# Patient Record
Sex: Female | Born: 1965 | Race: White | Hispanic: No | Marital: Married
Health system: Southern US, Community
[De-identification: ages and names within clinical notes are randomized; demographics above are authoritative.]

## PROBLEM LIST (undated history)

## (undated) DIAGNOSIS — I10 Essential (primary) hypertension: Secondary | ICD-10-CM

## (undated) DIAGNOSIS — F32A Depression, unspecified: Secondary | ICD-10-CM

---

## 2020-03-13 ENCOUNTER — Emergency Department (HOSPITAL_COMMUNITY)
Admission: EM | Admit: 2020-03-13 | Discharge: 2020-03-13 | Disposition: A | Discharge status: Home / Self Care | Payer: Self-pay | Attending: Emergency Medicine | Admitting: Emergency Medicine

## 2020-03-13 ENCOUNTER — Emergency Department (HOSPITAL_COMMUNITY): Payer: Self-pay

## 2020-03-13 ENCOUNTER — Other Ambulatory Visit: Payer: Self-pay

## 2020-03-13 ENCOUNTER — Encounter (HOSPITAL_COMMUNITY): Payer: Self-pay

## 2020-03-13 DIAGNOSIS — R0789 Other chest pain: Secondary | ICD-10-CM | POA: Insufficient documentation

## 2020-03-13 DIAGNOSIS — R42 Dizziness and giddiness: Secondary | ICD-10-CM | POA: Insufficient documentation

## 2020-03-13 DIAGNOSIS — Z5321 Procedure and treatment not carried out due to patient leaving prior to being seen by health care provider: Secondary | ICD-10-CM | POA: Insufficient documentation

## 2020-03-13 HISTORY — DX: Essential (primary) hypertension: I10

## 2020-03-13 HISTORY — DX: Depression, unspecified: F32.A

## 2020-03-13 LAB — BASIC METABOLIC PANEL
Anion gap: 12 (ref 5–15)
BUN: 23 mg/dL — ABNORMAL HIGH (ref 6–20)
CO2: 24 mmol/L (ref 22–32)
Calcium: 8.9 mg/dL (ref 8.9–10.3)
Chloride: 105 mmol/L (ref 98–111)
Creatinine, Ser: 0.75 mg/dL (ref 0.44–1.00)
GFR calc Af Amer: >60 mL/min (ref >60)
GFR calc non Af Amer: >60 mL/min (ref >60)
Glucose, Bld: 98 mg/dL (ref 70–99)
Potassium: 3.6 mmol/L (ref 3.5–5.1)
Sodium: 141 mmol/L (ref 135–145)

## 2020-03-13 LAB — CBC
HCT: 42.1 % (ref 36.0–46.0)
Hemoglobin: 13.5 g/dL (ref 12.0–15.0)
MCH: 30.7 pg (ref 26.0–34.0)
MCHC: 32.1 g/dL (ref 30.0–36.0)
MCV: 95.7 fL (ref 80.0–100.0)
Platelets: 286 10*3/uL (ref 150–400)
RBC: 4.4 MIL/uL (ref 3.87–5.11)
RDW: 13.2 % (ref 11.5–15.5)
WBC: 9.1 10*3/uL (ref 4.0–10.5)
nRBC: 0 % (ref 0.0–0.2)

## 2020-03-13 LAB — I-STAT BETA HCG BLOOD, ED (MC, WL, AP ONLY): I-stat hCG, quantitative: 5.3 m[IU]/mL — ABNORMAL HIGH (ref <5)

## 2020-03-13 LAB — TROPONIN I (HIGH SENSITIVITY): Troponin I (High Sensitivity): 5 ng/L (ref <18)

## 2020-03-13 MED ORDER — SODIUM CHLORIDE 0.9% FLUSH: 3.0000 mL | Freq: Once | INTRAVENOUS | Status: DC

## 2020-03-13 NOTE — ED Triage Notes (Signed)
Pt presents to ED with complaints of chest pressure radiating up neck that "feels like something is sitting on her chest". Recent cough. She endorses feeling dizzy. Denies n/v  VSS, 324MG  ASA  20G LAC

## 2020-03-13 NOTE — ED Notes (Signed)
Pt stated they wanted to leave and was encouraged to stay by [facility] staff and the pt decided to leave

## 2021-09-20 IMAGING — DX DG CHEST 2V
2 series · 2 of 2 positions shown · non-contrast
Comparison: None.

CLINICAL DATA: Chest pain and recent cough.  Dizziness.

EXAM:
CHEST - 2 VIEW

[w chest pa]
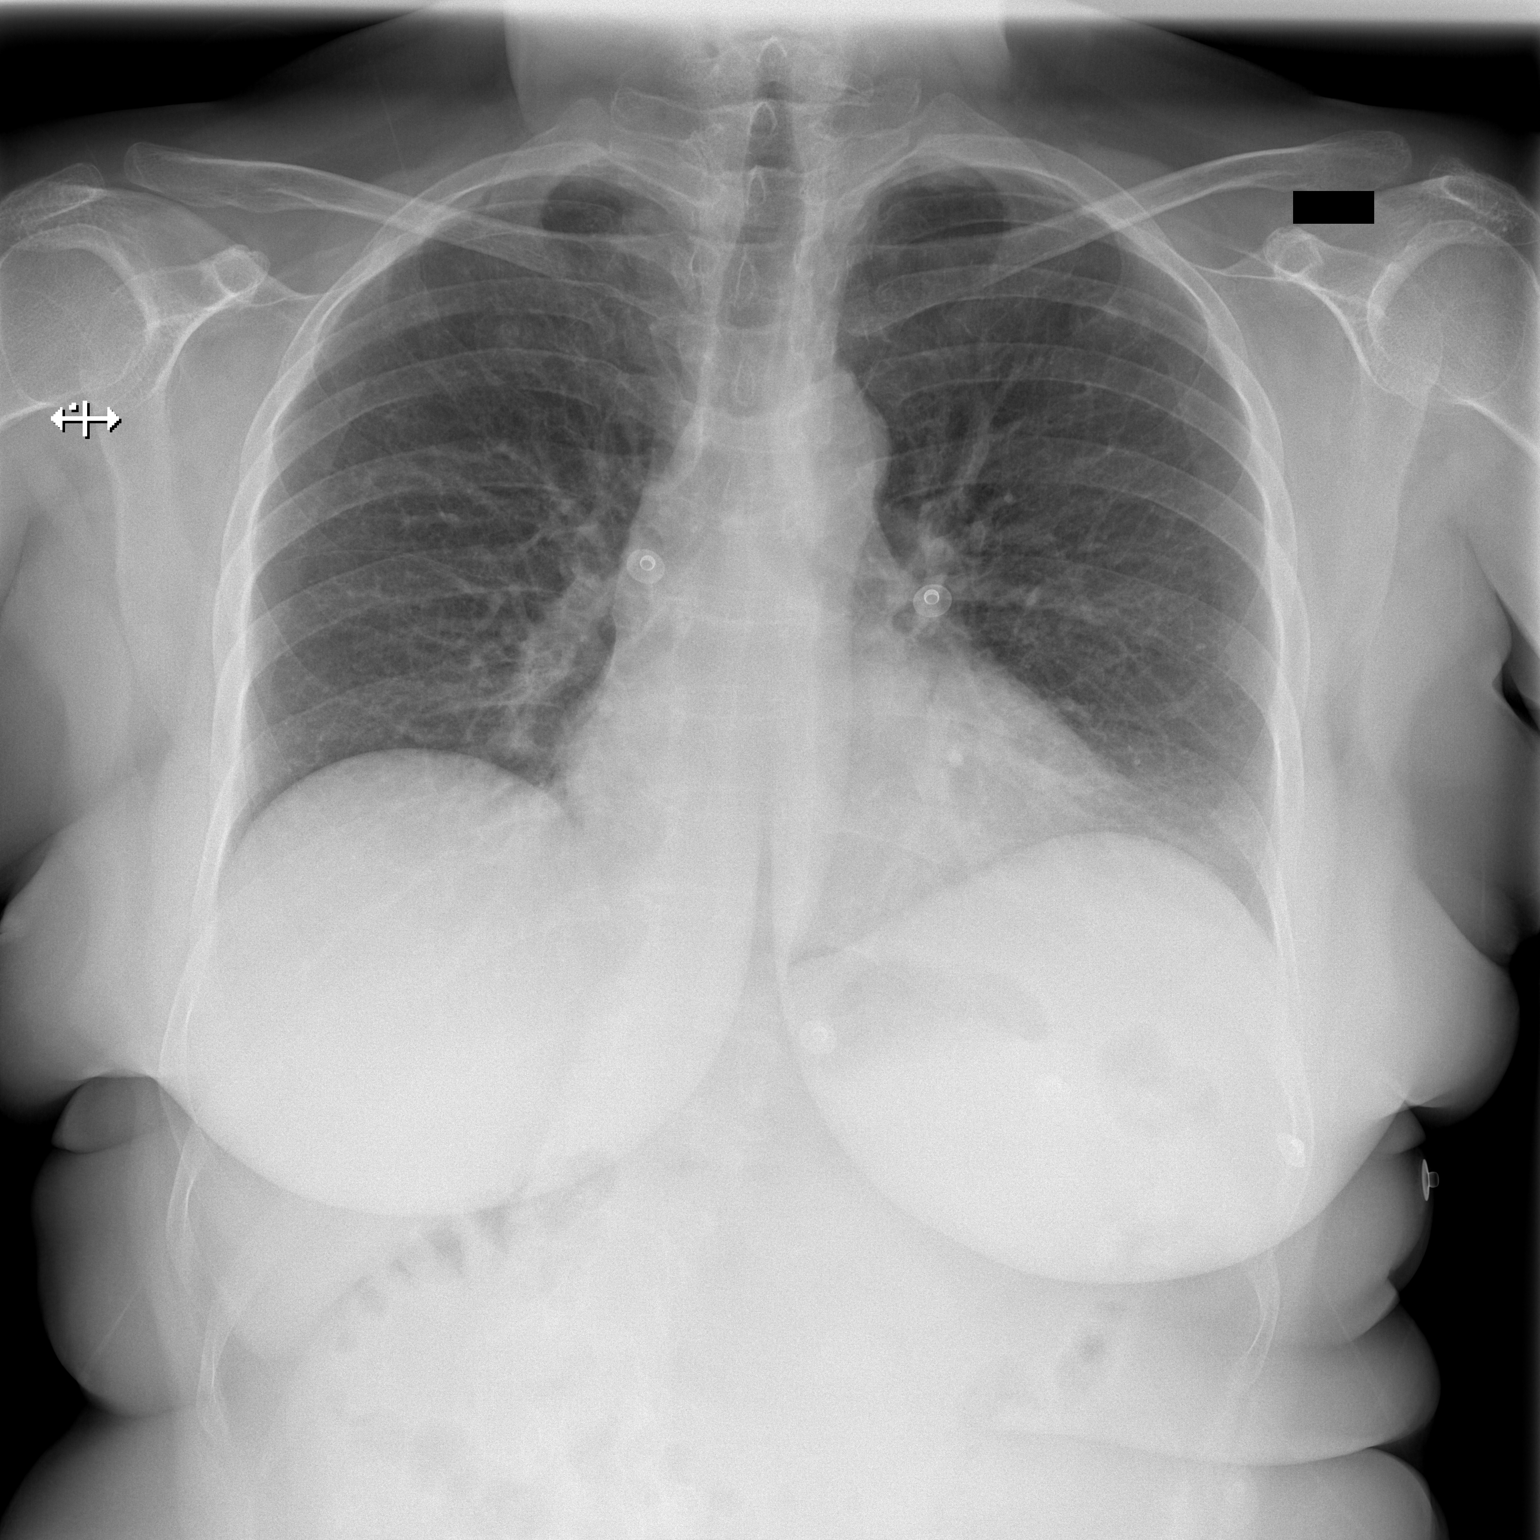

[w chest lat]
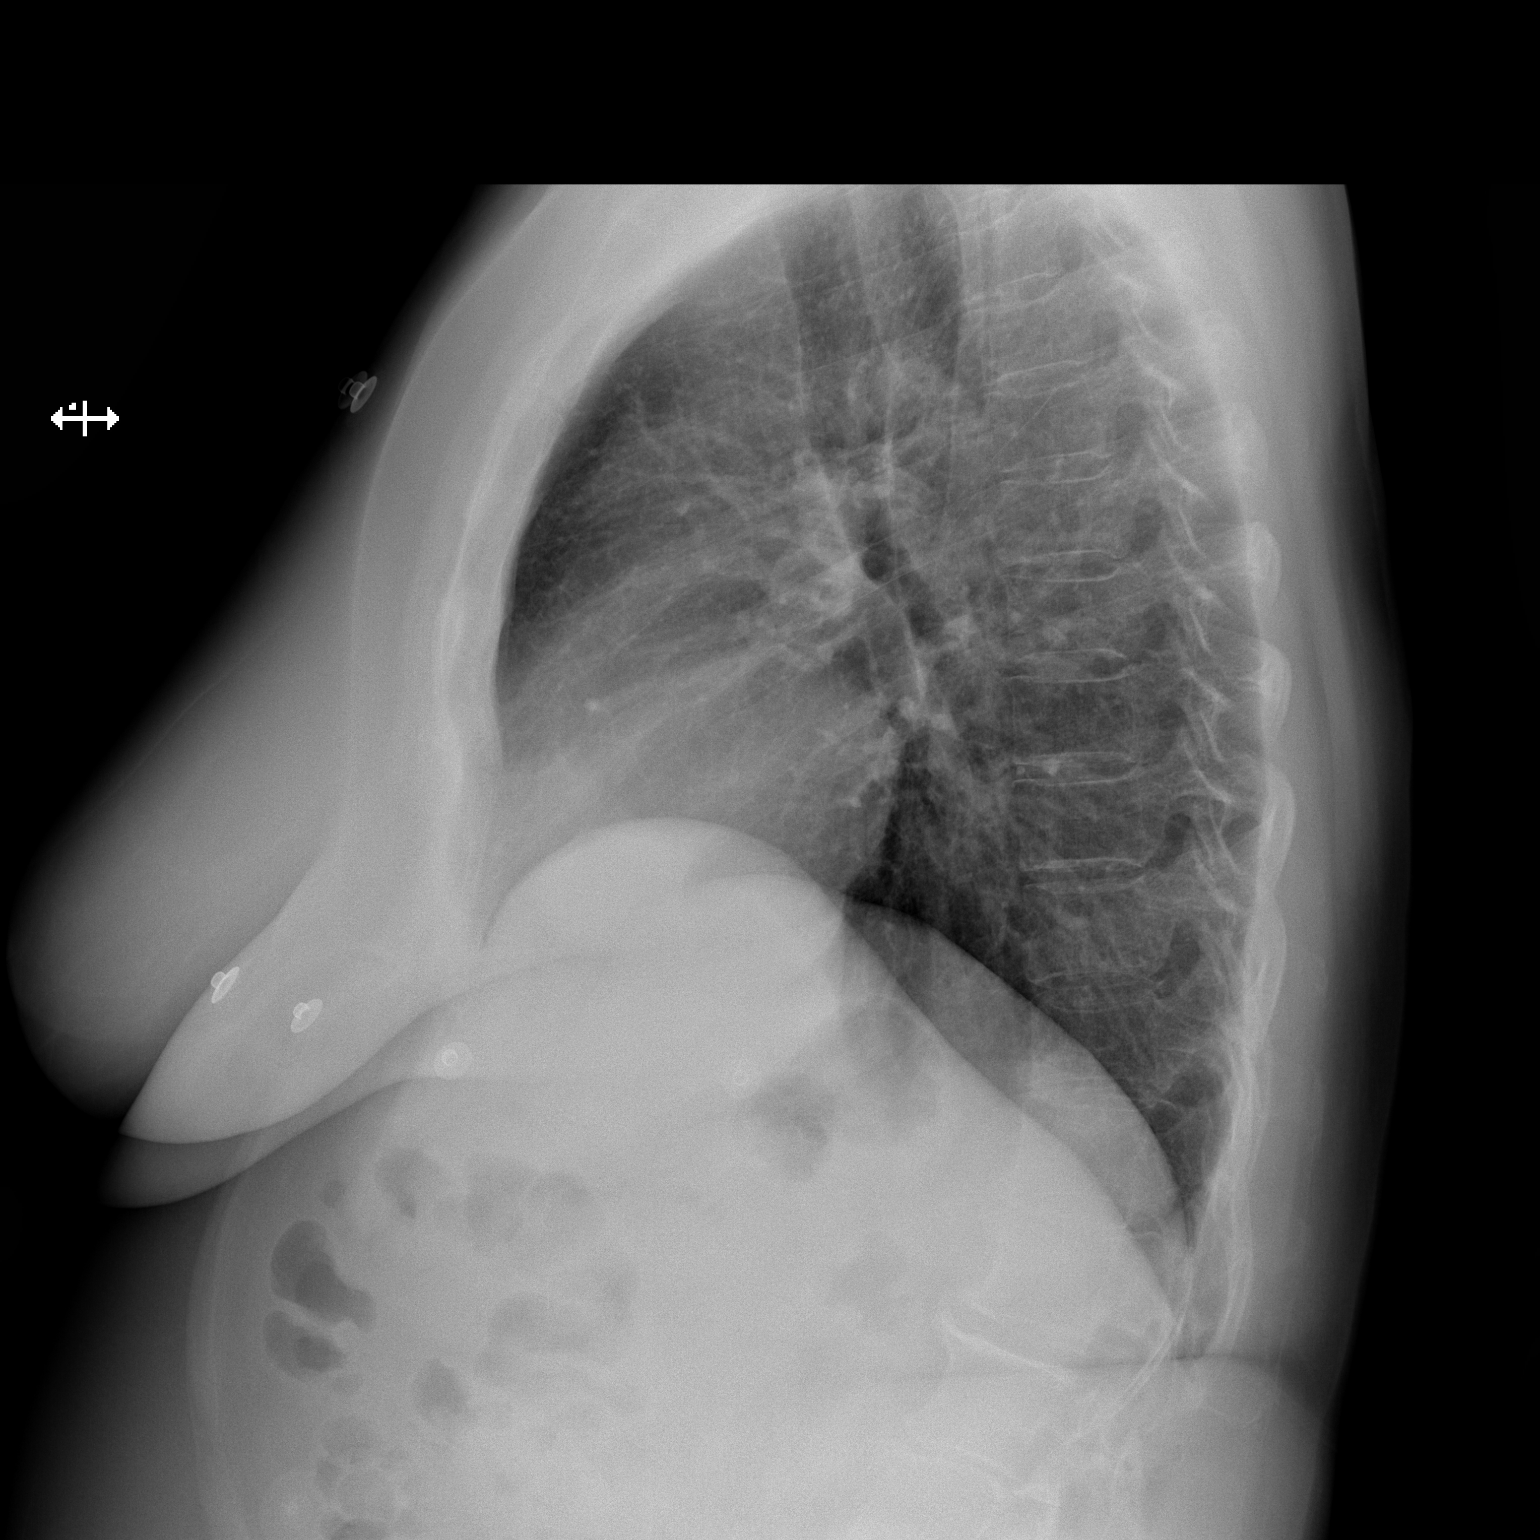

[2 of 2 positions shown; findings below may reference images not displayed]

FINDINGS: Normal sized heart. Clear lungs with normal vascularity.
Thoracolumbar spine degenerative changes.
IMPRESSION: No acute abnormality.
